# Patient Record
Sex: Female | Born: 1945 | Race: Black or African American | Hispanic: No | Smoking: Never smoker
Health system: Southern US, Community
[De-identification: ages and names within clinical notes are randomized; demographics above are authoritative.]

## PROBLEM LIST (undated history)

## (undated) DIAGNOSIS — C801 Malignant (primary) neoplasm, unspecified: Secondary | ICD-10-CM

## (undated) DIAGNOSIS — I10 Essential (primary) hypertension: Secondary | ICD-10-CM

## (undated) HISTORY — PX: BREAST SURGERY: SHX581

---

## 2018-04-25 ENCOUNTER — Other Ambulatory Visit: Payer: Self-pay

## 2018-04-25 ENCOUNTER — Emergency Department (HOSPITAL_BASED_OUTPATIENT_CLINIC_OR_DEPARTMENT_OTHER): Payer: Medicare Other

## 2018-04-25 ENCOUNTER — Encounter (HOSPITAL_BASED_OUTPATIENT_CLINIC_OR_DEPARTMENT_OTHER): Payer: Self-pay | Admitting: Emergency Medicine

## 2018-04-25 ENCOUNTER — Emergency Department (HOSPITAL_BASED_OUTPATIENT_CLINIC_OR_DEPARTMENT_OTHER)
Admission: EM | Admit: 2018-04-25 | Discharge: 2018-04-25 | Disposition: A | Discharge status: Home / Self Care | Payer: Medicare Other | Attending: Emergency Medicine | Admitting: Emergency Medicine

## 2018-04-25 DIAGNOSIS — I5089 Other heart failure: Secondary | ICD-10-CM | POA: Diagnosis not present

## 2018-04-25 DIAGNOSIS — I110 Hypertensive heart disease with heart failure: Secondary | ICD-10-CM | POA: Insufficient documentation

## 2018-04-25 DIAGNOSIS — Z859 Personal history of malignant neoplasm, unspecified: Secondary | ICD-10-CM | POA: Diagnosis not present

## 2018-04-25 DIAGNOSIS — Z79899 Other long term (current) drug therapy: Secondary | ICD-10-CM | POA: Diagnosis not present

## 2018-04-25 DIAGNOSIS — R0602 Shortness of breath: Secondary | ICD-10-CM

## 2018-04-25 DIAGNOSIS — I509 Heart failure, unspecified: Secondary | ICD-10-CM

## 2018-04-25 HISTORY — DX: Essential (primary) hypertension: I10

## 2018-04-25 HISTORY — DX: Malignant (primary) neoplasm, unspecified: C80.1

## 2018-04-25 LAB — COMPREHENSIVE METABOLIC PANEL
ALT: 16 U/L (ref 0–44)
AST: 18 U/L (ref 15–41)
Albumin: 3.9 g/dL (ref 3.5–5.0)
Alkaline Phosphatase: 53 U/L (ref 38–126)
Anion gap: 4 — ABNORMAL LOW (ref 5–15)
BUN: 19 mg/dL (ref 8–23)
CO2: 26 mmol/L (ref 22–32)
Calcium: 9.4 mg/dL (ref 8.9–10.3)
Chloride: 108 mmol/L (ref 98–111)
Creatinine, Ser: 0.85 mg/dL (ref 0.44–1.00)
GFR calc Af Amer: >60 mL/min (ref >60)
GFR calc non Af Amer: >60 mL/min (ref >60)
Glucose, Bld: 112 mg/dL — ABNORMAL HIGH (ref 70–99)
Potassium: 3.7 mmol/L (ref 3.5–5.1)
Sodium: 138 mmol/L (ref 135–145)
TOTAL PROTEIN: 6.9 g/dL (ref 6.5–8.1)
Total Bilirubin: 0.5 mg/dL (ref 0.3–1.2)

## 2018-04-25 LAB — CBC WITH DIFFERENTIAL/PLATELET
Abs Immature Granulocytes: 0.01 10*3/uL (ref 0.00–0.07)
Basophils Absolute: 0 10*3/uL (ref 0.0–0.1)
Basophils Relative: 0 %
EOS PCT: 2 %
Eosinophils Absolute: 0.1 10*3/uL (ref 0.0–0.5)
HCT: 41.8 % (ref 36.0–46.0)
Hemoglobin: 12.9 g/dL (ref 12.0–15.0)
Immature Granulocytes: 0 %
LYMPHS PCT: 38 %
Lymphs Abs: 2 10*3/uL (ref 0.7–4.0)
MCH: 26.8 pg (ref 26.0–34.0)
MCHC: 30.9 g/dL (ref 30.0–36.0)
MCV: 86.9 fL (ref 80.0–100.0)
Monocytes Absolute: 0.4 10*3/uL (ref 0.1–1.0)
Monocytes Relative: 7 %
NEUTROS ABS: 2.7 10*3/uL (ref 1.7–7.7)
NEUTROS PCT: 53 %
Platelets: 295 10*3/uL (ref 150–400)
RBC: 4.81 MIL/uL (ref 3.87–5.11)
RDW: 13.3 % (ref 11.5–15.5)
WBC: 5.3 10*3/uL (ref 4.0–10.5)
nRBC: 0 % (ref 0.0–0.2)

## 2018-04-25 LAB — TROPONIN I: Troponin I: <0.03 ng/mL (ref <0.03)

## 2018-04-25 LAB — BRAIN NATRIURETIC PEPTIDE: B Natriuretic Peptide: 116.1 pg/mL — ABNORMAL HIGH (ref 0.0–100.0)

## 2018-04-25 MED ORDER — FUROSEMIDE 10 MG/ML IJ SOLN
20.0000 mg | Freq: Once | INTRAMUSCULAR | Status: AC
  Administered 2018-04-25: 20 mg via INTRAVENOUS
  Filled 2018-04-25: qty 2

## 2018-04-25 MED ORDER — FUROSEMIDE 20 MG PO TABS: 20.0000 mg | ORAL_TABLET | Freq: Every day | ORAL | 0 refills | Status: AC

## 2018-04-25 NOTE — Discharge Instructions (Addendum)
We are  starting you on a fluid pill for the next 3 days to help with the fluid and shortness of breath. We suspect you might have mild heart failure. You will need an Echocardiogram and follow up with PCP and cardiology. Continue to take your blood pressure medication as prescribed. You will need repeat chemistry panel to check on your potassium.

## 2018-04-25 NOTE — ED Triage Notes (Signed)
Pt reports SOB with exertion and palpitations x 2 weeks.

## 2018-04-25 NOTE — ED Provider Notes (Signed)
[facility] EMERGENCY DEPARTMENT Provider Note   CSN: [id number] Arrival date & time: 04/25/18  1696     History   Chief Complaint Chief Complaint  Patient presents with  . Shortness of Breath    HPI Heather Mcintosh is a 73 y.o. female with a past medical history significant for HTN, urinary urgency, goiter and osteoporosis who presents today complaining of shortness of breath with exertion and palpitations. Patient reports that symptoms started 2 weeks ago. She has also noticed mild lower extremity swelling. Patient reports she has not taken her BP meds as she should. Patient also noticed that she has been burping more when she bends over.  Patient has no history of GERD. She denies any chest pain, diaphoresis, nausea, abdominal pain, dizziness, headaches or vision changes. Patient also denies any fever, chills, cough or sick contact.   HPI  Past Medical History:  Diagnosis Date  . Cancer ([place])   . Hypertension     There are no active problems to display for this patient.   Past Surgical History:  Procedure Laterality Date  . BREAST SURGERY       OB History   No obstetric history on file.      Home Medications    Prior to Admission medications   Medication Sig Start Date End Date Taking? Authorizing Provider  enalapril (VASOTEC) 10 MG tablet Take 10 mg by mouth daily.   Yes [provider]  hydrochlorothiazide (HYDRODIURIL) 25 MG tablet Take 25 mg by mouth daily.   Yes [provider]  furosemide (LASIX) 20 MG tablet Take 1 tablet (20 mg total) by mouth daily for 3 days. 04/25/18 04/28/18  Marjie Skiff, MD    Family History No family history on file.  Social History Social History   Tobacco Use  . Smoking status: Never Smoker  . Smokeless tobacco: Never Used  Substance Use Topics  . Alcohol use: Never    Frequency: Never  . Drug use: Not on file     Allergies   Patient has no known allergies.   Review of  Systems Review of Systems  Constitutional: Negative.   HENT: Negative.   Eyes: Negative.   Respiratory: Positive for shortness of breath.   Cardiovascular: Negative for chest pain.  Gastrointestinal: Negative for abdominal pain and nausea.  Endocrine: Negative.   Genitourinary: Negative.   Musculoskeletal: Negative.   Neurological: Negative.     Physical Exam Updated Vital Signs BP 138/78   Pulse 69   Temp 98.1 F (36.7 C) (Oral)   Resp 15   Ht 5\' 3"  (1.6 m)   Wt 84.4 kg   SpO2 98%   BMI 32.95 kg/m   Physical Exam   ED Treatments / Results  Labs (all labs ordered are listed, but only abnormal results are displayed) Labs Reviewed  COMPREHENSIVE METABOLIC PANEL - Abnormal; Notable for the following components:      Result Value   Glucose, Bld 112 (*)    Anion gap 4 (*)    All other components within normal limits  BRAIN NATRIURETIC PEPTIDE - Abnormal; Notable for the following components:   B Natriuretic Peptide 116.1 (*)    All other components within normal limits  CBC WITH DIFFERENTIAL/PLATELET  TROPONIN I    EKG EKG Interpretation  Date/Time:  Saturday April 25 2018 09:03:53 EST Ventricular Rate:  84 PR Interval:    QRS Duration: 93 QT Interval:  396 QTC Calculation: 469 R Axis:   -23 Text  Interpretation:  Sinus rhythm Left ventricular hypertrophy Anterior Q waves, possibly due to LVH Baseline wander in lead(s) V3 V4 No previous ECGs available Confirmed by Wandra Arthurs ([id number]) on 04/25/2018 9:10:22 AM   Radiology Dg Chest 2 View  Result Date: 04/25/2018 CLINICAL DATA:  73 year-old female c/o SOB with exertion and palpitations x 2 weeks.Hx of HTN meds, LEFT Mastectomy in 2005 EXAM: CHEST - 2 VIEW COMPARISON:  None. FINDINGS: Cardiac silhouette borderline enlarged. No mediastinal or hilar masses. No evidence of adenopathy. Clear lungs.  No pleural effusion or pneumothorax. Skeletal structures are demineralized. Mild compression deformity of a midthoracic  vertebra. IMPRESSION: 1. No acute cardiopulmonary disease. 2. Mild compression fracture of a midthoracic vertebra, most likely chronic. Electronically Signed   By: Lajean Manes M.D.   On: 04/25/2018 09:32    Procedures Procedures (including critical care time)  Medications Ordered in ED Medications  furosemide (LASIX) injection 20 mg (20 mg Intravenous Given 04/25/18 1114)     Initial Impression / Assessment and Plan / ED Course  I have reviewed the triage vital signs and the nursing notes.  Pertinent labs & imaging results that were available during my care of the patient were reviewed by me and considered in my medical decision making (see chart for details).   Patient is a 73 yo female who presents today with SOB on exertion and palpitations for the past two weeks. On admission BP was elevated to 171/94, and EKG showed NSR with sign of LVH. CXR was remarkable for a moderately enlarged cardiac silhouette and no obvious sign of pulmonary edema. On exam, patient had mild lower lungs crackles with trace/+1 edema in her lower extremities bilaterally. O2 sat and rest of vitals are within normals. CBC and CMP were unremarkable however BNP was slightly elevated. Patient was given 20 mg lasix IV and discharge on 3 days of po lasix with close follow up with PCP and cardiology. Patient will likely need a echocardiogram and repeat BMP to follow up on potassium.  Final Clinical Impressions(s) / ED Diagnoses   Final diagnoses:  Shortness of breath  Other congestive heart failure [facility])    ED Discharge Orders         Ordered    furosemide (LASIX) 20 MG tablet  Daily     04/25/18 1115           Marjie Skiff, MD 04/25/18 1119    Drenda Freeze, MD 04/25/18 [phone]

## 2020-03-03 IMAGING — DX DG CHEST 2V
2 series · 2 of 2 positions shown · non-contrast
Comparison: None.

CLINICAL DATA: 72 year-old female c/o SOB with exertion and
palpitations x 2 weeks.Hx of HTN meds, LEFT Mastectomy in 5995

EXAM:
CHEST - 2 VIEW

[chest pa]
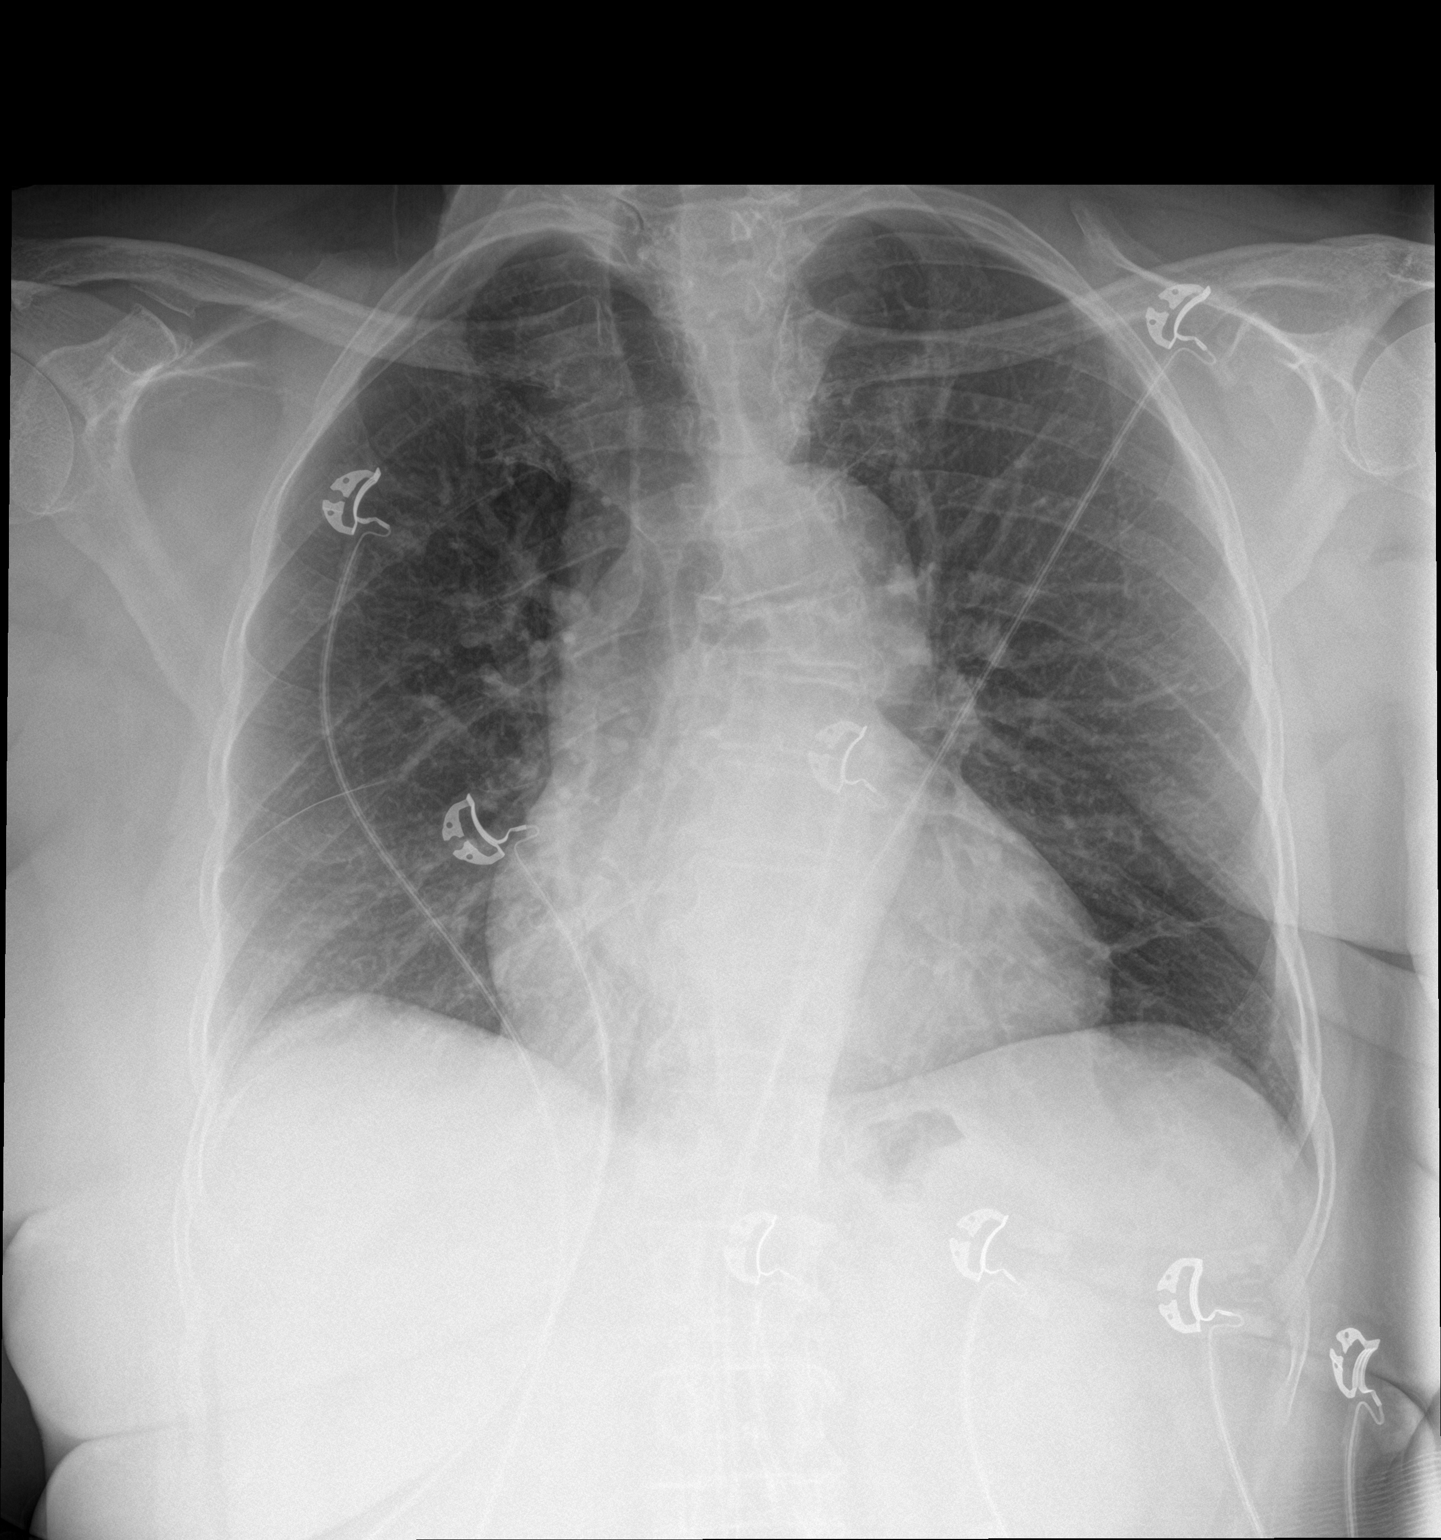

[chest lat]
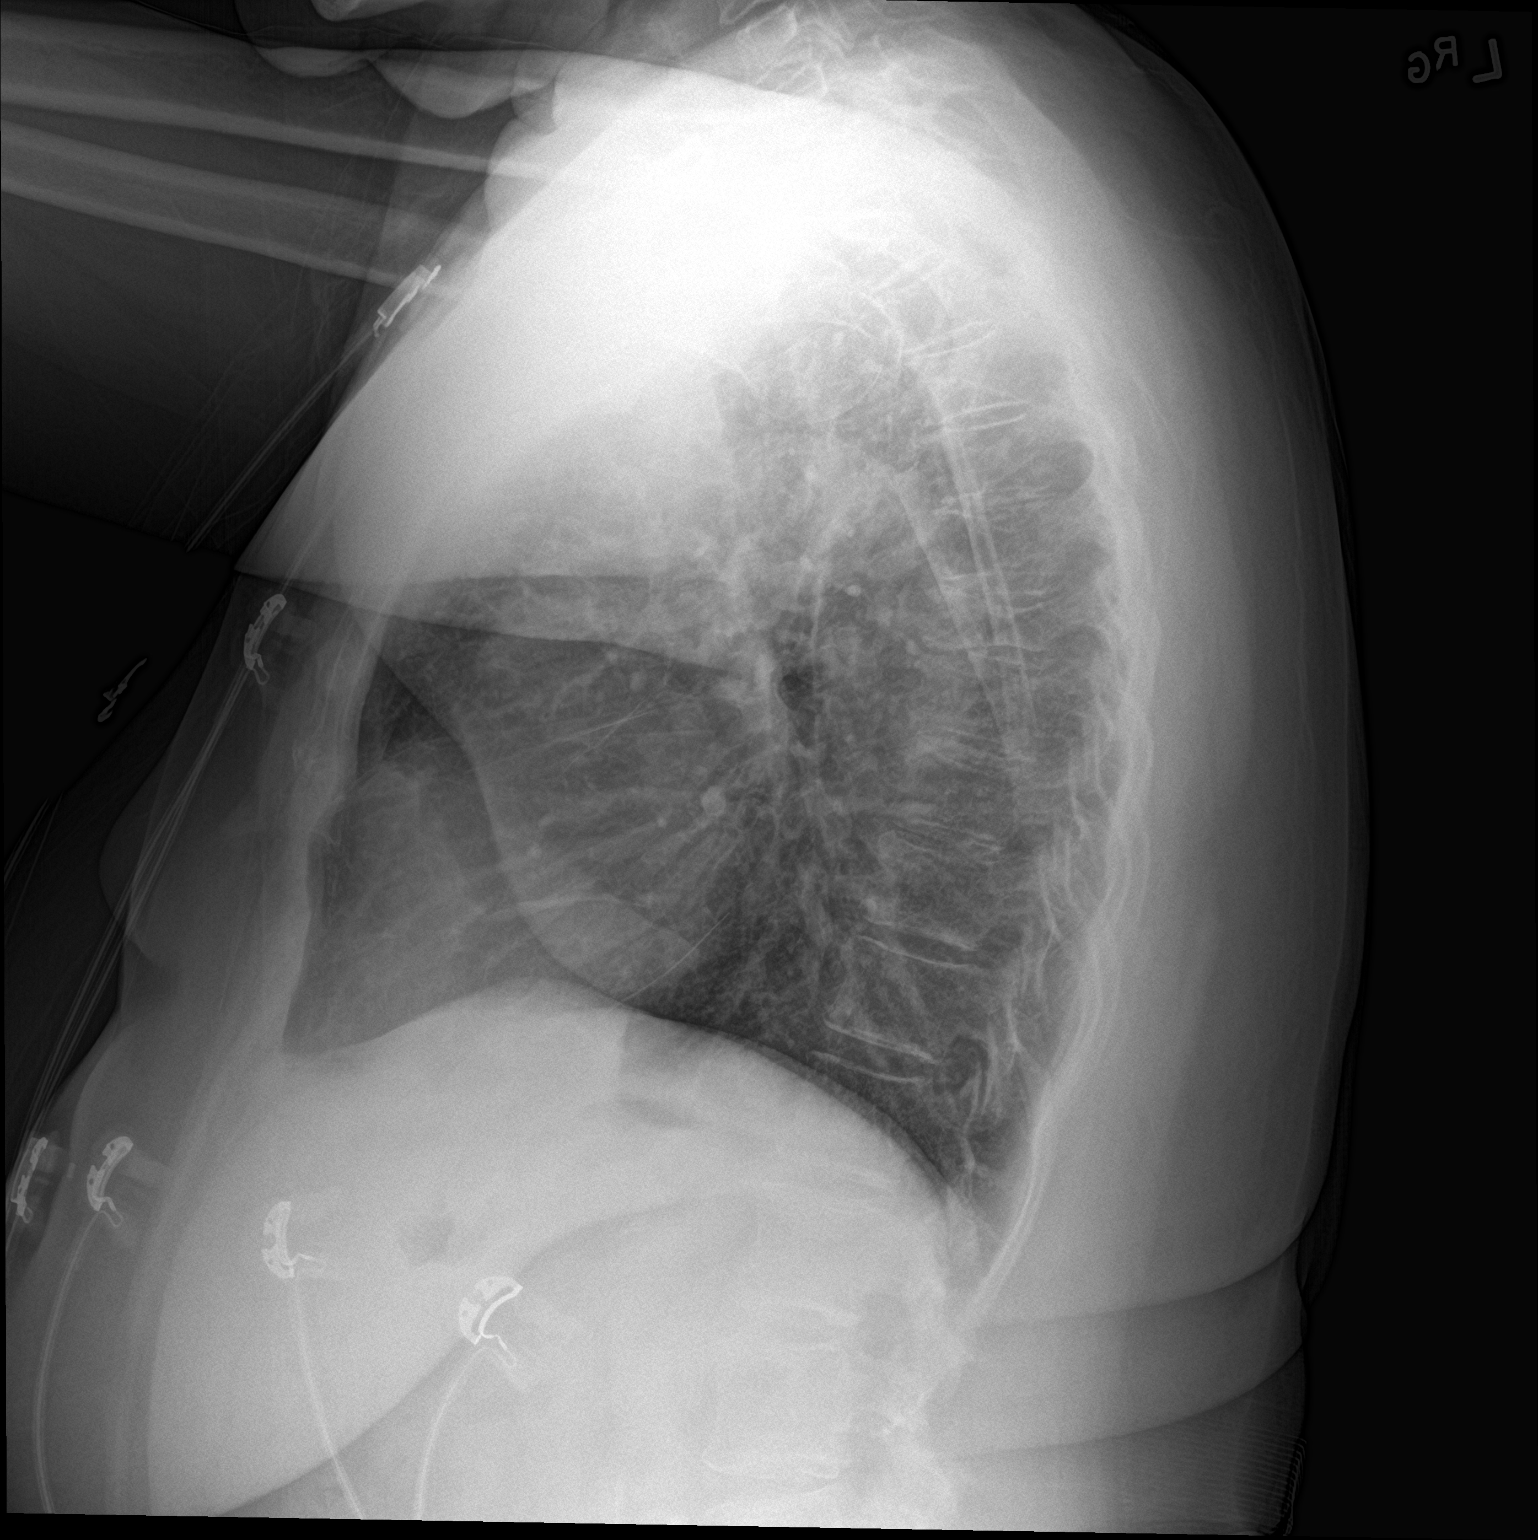

[2 of 2 positions shown; findings below may reference images not displayed]

FINDINGS: Cardiac silhouette borderline enlarged. No mediastinal or hilar
masses. No evidence of adenopathy.

Clear lungs.  No pleural effusion or pneumothorax.

Skeletal structures are demineralized. Mild compression deformity of
a midthoracic vertebra.
IMPRESSION: 1. No acute cardiopulmonary disease.
2. Mild compression fracture of a midthoracic vertebra, most likely
chronic.

## 2023-01-17 ENCOUNTER — Other Ambulatory Visit (HOSPITAL_BASED_OUTPATIENT_CLINIC_OR_DEPARTMENT_OTHER): Payer: Self-pay

## 2023-01-17 ENCOUNTER — Ambulatory Visit
Admission: EM | Admit: 2023-01-17 | Discharge: 2023-01-17 | Disposition: A | Discharge status: Home / Self Care | Payer: Medicare PPO | Attending: Family Medicine | Admitting: Family Medicine

## 2023-01-17 DIAGNOSIS — K1121 Acute sialoadenitis: Secondary | ICD-10-CM | POA: Diagnosis not present

## 2023-01-17 DIAGNOSIS — H6991 Unspecified Eustachian tube disorder, right ear: Secondary | ICD-10-CM | POA: Diagnosis not present

## 2023-01-17 MED ORDER — PREDNISONE 20 MG PO TABS
ORAL_TABLET | ORAL | 0 refills | Status: AC
  Filled 2023-01-17: qty 11, 7d supply, fill #0

## 2023-01-17 NOTE — ED Triage Notes (Signed)
Pt c/o RT sided jaw pain x 1 week. Also having some ear pain. Was seen at another UC on Tues. Dx with ear infection, tx with Augmentin. No improvement, noticing more swelling in side of face.

## 2023-01-17 NOTE — ED Provider Notes (Signed)
[facility]    CSN: [id number] Arrival date & time: 01/17/23  0916      History   Chief Complaint Chief Complaint  Patient presents with   Jaw Pain    RT    HPI Heather Mcintosh is a 77 y.o. female.   Patient developed itching and vague pain in her right ear and jaw area one week ago.  Three days ago she visited an urgent care center where she was diagnosed with right ear infection and started on Augmentin.  She has not improved, developing increasing pain/swelling in the right side of her face.  She denies drainage from her right ear, nasal congestion, sore throat, difficulty chewing, toothache, and fevers, chills, and sweats.  She notes that her pain is worse after eating.  The history is provided by the patient and a relative.  Otalgia Location:  Right Behind ear:  No abnormality Quality:  Aching and dull Severity:  Mild Onset quality:  Gradual Duration:  1 week Timing:  Constant Progression:  Worsening Chronicity:  New Context: not direct blow, not elevation change, not foreign body in ear, not recent URI and not water in ear   Relieved by:  Nothing Worsened by:  Palpation Ineffective treatments: Augmentin. Associated symptoms: no abdominal pain, no congestion, no cough, no ear discharge, no fever, no headaches, no hearing loss, no neck pain, no rash, no rhinorrhea, no sore throat and no tinnitus   Risk factors: no recent travel and no chronic ear infection     Past Medical History:  Diagnosis Date   Cancer ([facility])    Hypertension     There are no problems to display for this patient.   Past Surgical History:  Procedure Laterality Date   BREAST SURGERY      OB History   No obstetric history on file.      Home Medications    Prior to Admission medications   Medication Sig Start Date End Date Taking? Authorizing Provider  amoxicillin-clavulanate (AUGMENTIN) 875-125 MG tablet Take 1 tablet by mouth 2 (two) times daily. 01/14/23  Yes [provider]  atenolol (TENORMIN) 50 MG tablet Take 50 mg by mouth daily.   Yes [provider]  predniSONE (DELTASONE) 20 MG tablet Take one tab by mouth twice daily for 4 days, then one daily for 3 days. Take with food. 01/17/23  Yes Lattie Haw, MD  atorvastatin (LIPITOR) 20 MG tablet Take 20 mg by mouth daily.    [provider]  enalapril (VASOTEC) 10 MG tablet Take 10 mg by mouth daily.    [provider]  furosemide (LASIX) 20 MG tablet Take 1 tablet (20 mg total) by mouth daily for 3 days. 04/25/18 04/28/18  Diallo, Lilia Argue, MD  hydrochlorothiazide (HYDRODIURIL) 25 MG tablet Take 25 mg by mouth daily.    [provider]  triamterene-hydrochlorothiazide (DYAZIDE) 37.5-25 MG capsule Take 1 capsule by mouth daily.    [provider]    Family History History reviewed. No pertinent family history.  Social History Social History   Tobacco Use   Smoking status: Never   Smokeless tobacco: Never  Substance Use Topics   Alcohol use: Never     Allergies   Patient has no known allergies.   Review of Systems Review of Systems  Constitutional:  Negative for appetite change, chills, diaphoresis, fatigue and fever.  HENT:  Positive for ear pain and facial swelling. Negative for congestion, dental problem, ear discharge, hearing loss, mouth  sores, rhinorrhea, sinus pressure, sinus pain, sore throat, tinnitus and trouble swallowing.   Eyes: Negative.   Respiratory:  Negative for cough.   Cardiovascular: Negative.   Gastrointestinal:  Negative for abdominal pain.  Genitourinary: Negative.   Musculoskeletal:  Negative for neck pain.  Skin:  Negative for rash.  Neurological:  Negative for headaches.     Physical Exam Triage Vital Signs ED Triage Vitals  Encounter Vitals Group     BP 01/17/23 1018 126/82     Systolic BP Percentile --      Diastolic BP Percentile --      Pulse Rate 01/17/23 1018 60     Resp 01/17/23 1018 17      Temp 01/17/23 1018 98.2 F (36.8 C)     Temp Source 01/17/23 1018 Oral     SpO2 01/17/23 1018 99 %     Weight --      Height --      Head Circumference --      Peak Flow --      Pain Score 01/17/23 1015 8     Pain Loc --      Pain Education --      Exclude from Growth Chart --    No data found.  Updated Vital Signs BP 126/82 (BP Location: Right Arm)   Pulse 60   Temp 98.2 F (36.8 C) (Oral)   Resp 17   SpO2 99%   Visual Acuity Right Eye Distance:   Left Eye Distance:   Bilateral Distance:    Right Eye Near:   Left Eye Near:    Bilateral Near:     Physical Exam Vitals and nursing note reviewed.  Constitutional:      General: She is not in acute distress.    Appearance: She is not ill-appearing.  HENT:     Head: Normocephalic.      Comments: There is mild swelling and distinct tenderness to palpation over the right parotid gland.  Palpation there recreates her pain.    Left Ear: Tympanic membrane, ear canal and external ear normal. There is no impacted cerumen.     Ears:     Comments: Right canal partly occluded with cerumen.  Unable to fully visualize right tympanic membrane although visible portion appears normal.    Nose: Nose normal.     Mouth/Throat:     Mouth: Mucous membranes are moist.     Pharynx: Oropharynx is clear.  Eyes:     Extraocular Movements: Extraocular movements intact.     Conjunctiva/sclera: Conjunctivae normal.     Pupils: Pupils are equal, round, and reactive to light.  Cardiovascular:     Rate and Rhythm: Normal rate and regular rhythm.     Heart sounds: Normal heart sounds.  Pulmonary:     Breath sounds: Normal breath sounds.  Musculoskeletal:     Cervical back: Neck supple.  Lymphadenopathy:     Cervical: No cervical adenopathy.  Skin:    General: Skin is warm and dry.  Neurological:     Mental Status: She is alert and oriented to person, place, and time.      UC Treatments / Results  Labs (all labs ordered are listed,  but only abnormal results are displayed) Labs Reviewed - No data to display  EKG   Radiology No results found.  Procedures Procedures   Tympanometry:  Right ear tympanogram positive peak pressure; Left ear tympanogram normal   Medications Ordered in UC Medications - No data  to display  Initial Impression / Assessment and Plan / UC Course  I have reviewed the triage vital signs and the nursing notes.  Pertinent labs & imaging results that were available during my care of the patient were reviewed by me and considered in my medical decision making (see chart for details).    Tympanogram does not suggest right otitis media.  Her symptoms and exam are consistent with parotitis. Continue Augmentin as prescribed. Begin prednisone burst/taper. Followup with ENT one week.  Final Clinical Impressions(s) / UC Diagnoses   Final diagnoses:  Parotitis, acute  Eustachian tube dysfunction, right     Discharge Instructions      Continue Augmentin as prescribed. Apply heat to the affected area two to three times daily. Use   a moist heat pack or a heating pad. Place a towel between your skin and the heat source. Leave the heat on for 20-30 minutes. Remove the heat if your skin turns bright red. This is especially important if you are unable to feel pain, heat, or cold. You have a greater risk of getting burned. Gargle with a mixture of salt and water 3-4 times a day or as needed. To make salt water, completely dissolve -1 tsp (3-6 g) of salt in 1 cup (237 mL) of warm water. Gently massage the parotid gland 3 to 4 times daily. Read the attached information sheet. If symptoms become significantly worse during the night or over the weekend, proceed to the local emergency room.   Try using Flonase nasal spray for congestion twice daily.     ED Prescriptions     Medication Sig Dispense Auth. Provider   predniSONE (DELTASONE) 20 MG tablet Take one tab by mouth twice daily for 4  days, then one daily for 3 days. Take with food. 11 tablet Lattie Haw, MD         Lattie Haw, MD 01/19/23 [phone]

## 2023-01-17 NOTE — Discharge Instructions (Addendum)
Continue Augmentin as prescribed. Apply heat to the affected area two to three times daily. Use   a moist heat pack or a heating pad. Place a towel between your skin and the heat source. Leave the heat on for 20-30 minutes. Remove the heat if your skin turns bright red. This is especially important if you are unable to feel pain, heat, or cold. You have a greater risk of getting burned. Gargle with a mixture of salt and water 3-4 times a day or as needed. To make salt water, completely dissolve -1 tsp (3-6 g) of salt in 1 cup (237 mL) of warm water. Gently massage the parotid gland 3 to 4 times daily. Read the attached information sheet. If symptoms become significantly worse during the night or over the weekend, proceed to the local emergency room.   Try using Flonase nasal spray for congestion twice daily.
